# Patient Record
Sex: Male | Born: 1980 | Race: Black or African American | Hispanic: No | Marital: Single | Smoking: Current every day smoker
Health system: Southern US, Community
[De-identification: ages and names within clinical notes are randomized; demographics above are authoritative.]

## PROBLEM LIST (undated history)

## (undated) DIAGNOSIS — E119 Type 2 diabetes mellitus without complications: Secondary | ICD-10-CM

## (undated) DIAGNOSIS — I10 Essential (primary) hypertension: Secondary | ICD-10-CM

## (undated) DIAGNOSIS — K3184 Gastroparesis: Secondary | ICD-10-CM

## (undated) DIAGNOSIS — N189 Chronic kidney disease, unspecified: Secondary | ICD-10-CM

## (undated) HISTORY — PX: ESOPHAGOGASTRODUODENOSCOPY: SHX1529

## (undated) HISTORY — DX: Type 2 diabetes mellitus without complications: E11.9

## (undated) HISTORY — DX: Chronic kidney disease, unspecified: N18.9

## (undated) HISTORY — DX: Gastroparesis: K31.84

## (undated) HISTORY — DX: Essential (primary) hypertension: I10

---

## 2018-12-22 ENCOUNTER — Telehealth: Payer: Self-pay | Admitting: Internal Medicine

## 2018-12-22 NOTE — Telephone Encounter (Signed)
The patient's boss is my patient and is advocating for patient to get an appointment to evaluate episodic severe vomiting and weight loss problems.  He has been seen in [place] and at [facility] notes seen in Care Everywhere - [facility] faxing info to [place] apparently.   Wants to be seen here in [place] - he works here, lives in [place].   Sounds like needs an in office appointment  OK to schedule with me when available whether or not faxed records are here

## 2018-12-23 NOTE — Telephone Encounter (Signed)
Left a message on his voicemail to call back and schedule an IN OFFICE appointment.

## 2018-12-25 ENCOUNTER — Encounter: Payer: Self-pay | Admitting: Internal Medicine

## 2019-01-06 ENCOUNTER — Telehealth: Payer: Self-pay

## 2019-01-06 NOTE — Telephone Encounter (Signed)
Covid-19 screening questions  Have you traveled in the last 14 days? yes If yes where? [place] to [place] where he lives  Do you now or have you had a fever in the last 14 days? no  Do you have any respiratory symptoms of shortness of breath or cough now or in the last 14 days? no  Do you have any family members or close contacts with diagnosed or suspected Covid-19 in the past 14 days? no  Have you been tested for Covid-19 and found to be positive? no

## 2019-01-07 ENCOUNTER — Encounter: Payer: Self-pay | Admitting: Internal Medicine

## 2019-01-07 ENCOUNTER — Ambulatory Visit (INDEPENDENT_AMBULATORY_CARE_PROVIDER_SITE_OTHER): Payer: 59 | Admitting: Internal Medicine

## 2019-01-07 ENCOUNTER — Encounter (INDEPENDENT_AMBULATORY_CARE_PROVIDER_SITE_OTHER): Payer: Self-pay

## 2019-01-07 ENCOUNTER — Other Ambulatory Visit: Payer: Self-pay

## 2019-01-07 VITALS — BP 134/72 | HR 95 | Temp 96.9°F | Ht 66.0 in | Wt 173.0 lb

## 2019-01-07 DIAGNOSIS — K3184 Gastroparesis: Secondary | ICD-10-CM | POA: Diagnosis not present

## 2019-01-07 DIAGNOSIS — R112 Nausea with vomiting, unspecified: Secondary | ICD-10-CM | POA: Diagnosis not present

## 2019-01-07 MED ORDER — PANTOPRAZOLE SODIUM 40 MG PO TBEC: 40.0000 mg | DELAYED_RELEASE_TABLET | Freq: Every day | ORAL | 0 refills | Status: DC

## 2019-01-07 MED ORDER — PANTOPRAZOLE SODIUM 40 MG PO TBEC: 40.0000 mg | DELAYED_RELEASE_TABLET | Freq: Every day | ORAL | 0 refills | Status: AC

## 2019-01-07 MED ORDER — PROCHLORPERAZINE 25 MG RE SUPP: 25.0000 mg | Freq: Two times a day (BID) | RECTAL | 1 refills | Status: DC | PRN

## 2019-01-07 MED ORDER — PROCHLORPERAZINE 25 MG RE SUPP: 25.0000 mg | Freq: Two times a day (BID) | RECTAL | 1 refills | Status: AC | PRN

## 2019-01-07 NOTE — Progress Notes (Addendum)
Gerald Leitzyrone Bostock 38 y.o. 07/13/1981 [id number]  Assessment & Plan:   Encounter Diagnoses  Name Primary?  . Intractable vomiting with nausea, unspecified vomiting type Yes  . Gastroparesis     I suspect he does have gastroparesis and that diabetes is a likely cause.  He could have a postinfectious problem however based upon the history.  Hemoglobin A1c was 7.9% in December.  So his diabetes is not controlled.  Not terrible however.  He has not responded to metoclopramide.  I do not think I can trust the partial gastric emptying study.  I do not think this is cyclic vomiting syndrome and he did not respond to a sumatriptan and.   I want him to repeat a 4-hour solid phase gastric emptying study.  Once I see those results we will figure out where to go from there.  He is not to take Reglan or other antiemetics 24 hours before that.  I have prescribed prochlorperazine suppositories to see if that will abort 1 of these events and have given him a three-step gastroparesis diet to use step 3 when he feels well and go all the way down to step 1 when he feels an episode is happening and use step to when in between.  Though I have details through the care everywhere I am requesting the exact copies of his endoscopy reports and other imaging studies.  Based upon what we have CT the abdomen and pelvis, ultrasound, CT of the brain and ruled out solid tumors etc.  I will send a copy to primary care provider Ashok NorrisKimberly Leftwich, NP in [place]. Phone #[phone].  RECORDS REVIEW   04/2018 EGD retained food stomach and duodenum, small hiatal hernia   EGD 12/2017 Gastritis, LA-C esophagitis, hiatal hernia bxs benign, no H pylori  Hospital records show sig hyperglycemia, elevated BP, report non-compliance with DM treatment Subjective:   Chief Complaint: Vomiting, gastroparesis?  HPI The patient is a 38 year old African-American man with a 1 year history of nausea and vomiting with a working  diagnosis of gastroparesis.  His boss is my patient and is trying to help him, so recommended he be evaluated here in [place] where his company is located.  He is a diabetic, insulin for many years.  He stated that about a year ago he got what he thought was food poisoning.  It happened again 2 weeks later.  He had gone out to eat both of those times.  ER visits ensued after those and he was treated and at some point he was admitted and has been admitted several times to the hospital in [place] and I have reviewed those records to the best of my ability though they are difficult to scan through they are in care everywhere.  Last year with those multiple admissions and visits, he had studies that included 2 upper GI endoscopies, 1 of which showed ulcerative esophagitis in the beginning.  He had a limited gastric emptying study with 10% emptying in the first 60 minutes but only went to 90 minutes and it was reported as never getting to 50% emptying.  CT the brain negative.  CT the abdomen and pelvis negative.  Gallbladder ultrasound negative except for fatty liver.  He does have labs showing kidney dysfunction with a creatinine of 1.9 and a GFR of about 49.  Mild transaminase elevation at times.  Often hemoconcentrated I suspect with hemoglobin slightly elevated.  He lost weight but says he is leveled off.  The episodes are haphazard.  In between he feels reasonably well.  He has been treated with metoclopramide ondansetron promethazine with out success.  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  He is currently working on a job in [place] the company is based here in [place] and his residence is in [place] though he is staying in [place] right now.  He we had a telehealth visit at [facility] and they wondered if he might not have cyclic vomiting syndrome and treated him with sumatriptan nasal spray but that did not make a difference.  Does not  significant diarrhea abdominal pain etc.  He has some loss of appetite when he gets sick.  He has had hypertension and diabetes for 6 years he says.  Denies any neuropathy symptoms or any retinopathy problems.  PPI has not made a difference either. No Known Allergies Current Meds  Medication Sig  . amLODipine (NORVASC) 5 MG tablet Take 1 tablet by mouth daily.  . hydrochlorothiazide (HYDRODIURIL) 25 MG tablet Take 1 tablet by mouth daily.  . Insulin Glargine (BASAGLAR KWIKPEN) 100 UNIT/ML SOPN Inject 10 Units into the skin 2 (two) times a day.  . lisinopril (ZESTRIL) 10 MG tablet Take 1 tablet by mouth daily.  . metoCLOPramide (REGLAN) 10 MG tablet Take 10 mg by mouth 4 (four) times daily as needed for nausea.  . ondansetron (ZOFRAN) 8 MG tablet Take 1 tablet by mouth daily.  . pravastatin (PRAVACHOL) 20 MG tablet Take 1 tablet by mouth daily.  . [DISCONTINUED] pantoprazole (PROTONIX) 40 MG tablet Take 40 mg by mouth daily.   Past Medical History:  Diagnosis Date  . Diabetes (HCC)   . Gastroparesis   . Hypertension    Past Surgical History:  Procedure Laterality Date  . ESOPHAGOGASTRODUODENOSCOPY     x 2 2019   Social History   Social History Narrative   He is single he reports having 3 sons and 3 daughters.   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   Smokes cigarettes, no drug use, no smokeless tobacco, 2 alcoholic beverages and 2 caffeinated beverages a day.   family history includes Diabetes in his mother.   Review of Systems As per HPI.  All other review of systems appear negative at this time.  Objective:   Physical Exam @BP  134/72   Pulse 95   Temp (!) 96.9 F (36.1 C)   Ht 5\' 6"  (1.676 m)   Wt 173 lb (78.5 kg)   BMI 27.92 kg/m @  General:  Well-developed, well-nourished and in no acute distress Eyes:  anicteric. ENT:   Mouth and posterior pharynx free of lesions.  Neck:   supple w/o thyromegaly or mass.  Lungs: Clear to auscultation bilaterally.  Heart:  S1S2, no rubs, murmurs, gallops. Abdomen:  soft, non-tender, no hepatosplenomegaly, hernia, or mass and BS+.  No succussion backslash Lymph:  no cervical or supraclavicular adenopathy. Extremities:   no edema, cyanosis or clubbing Skin   no rash. Neuro:  A&O x 3.  Psych:  appropriate mood and  Affect.   Data Reviewed: Extensive records review from his [place] hospitalizations, and his [facility] visit.

## 2019-01-07 NOTE — Patient Instructions (Addendum)
We will schedule a 4 hour solid-phase gastric emptying study. (01/28/2019 at 7:30AM, arrive at 7:00AM) at [facility], nothing to eat or drink after midnight.  Please do not take Reglan (metaclopramide), ondansetron or other anti-nausea medications for 24 hours before the emptying test.   We will get records of the upper GI endoscopy tests, any xrays from [facility] in [place].  Try prochlorperazine suppositories next episode.  Follow the gastrparesis diet - step 3 when ok, step 2 in between and step 1 when not well and reverse as you recover.  I appreciate the opportunity to care for you. Iva Boop, MD, [facility]

## 2019-01-08 ENCOUNTER — Encounter: Payer: Self-pay | Admitting: Internal Medicine

## 2019-01-08 ENCOUNTER — Telehealth: Payer: Self-pay

## 2019-01-08 NOTE — Telephone Encounter (Signed)
-----   Message from Iva Boop, MD sent at 01/08/2019  3:17 PM EDT ----- Regarding: Fax copy to primary care provider If you look in my note you will see a phone number for his PCP.  You may be able to google and get a fax number but we need to send a copy of my note to her.

## 2019-01-08 NOTE — Telephone Encounter (Signed)
Faxed to his PCP, Ashok Norris, NP, fax # [phone], phone # [phone].

## 2019-01-28 ENCOUNTER — Encounter (HOSPITAL_COMMUNITY)
Admission: RE | Admit: 2019-01-28 | Discharge: 2019-01-28 | Disposition: A | Discharge status: Home / Self Care | Payer: 59 | Source: Ambulatory Visit | Attending: Internal Medicine | Admitting: Internal Medicine

## 2019-01-28 ENCOUNTER — Other Ambulatory Visit: Payer: Self-pay

## 2019-01-28 DIAGNOSIS — R112 Nausea with vomiting, unspecified: Secondary | ICD-10-CM | POA: Insufficient documentation

## 2019-01-28 DIAGNOSIS — K3184 Gastroparesis: Secondary | ICD-10-CM

## 2019-01-28 MED ORDER — TECHNETIUM TC 99M SULFUR COLLOID
1.9000 | Freq: Once | INTRAVENOUS | Status: AC | PRN
  Administered 2019-01-28: 1.9 via INTRAVENOUS

## 2019-01-28 NOTE — Progress Notes (Signed)
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 This results from nerve damage because of years of high blood sugars.  He needs to follow the gastroparesis diet we gave him and set up a f/u (virtual ok) so I can discuss in more detail and consider other treatment
